# Patient Record
Sex: Female | Born: 1970 | Race: White | Hispanic: No | Marital: Married | Smoking: Former smoker
Health system: Southern US, Community
[De-identification: ages and names within clinical notes are randomized; demographics above are authoritative.]

## PROBLEM LIST (undated history)

## (undated) DIAGNOSIS — Z789 Other specified health status: Secondary | ICD-10-CM

## (undated) HISTORY — PX: TUBAL LIGATION: SHX77

## (undated) HISTORY — PX: BREAST BIOPSY: SHX20

## (undated) HISTORY — DX: Other specified health status: Z78.9

---

## 2009-07-11 HISTORY — PX: UTERINE FIBROID SURGERY: SHX826

## 2013-11-14 ENCOUNTER — Other Ambulatory Visit: Payer: Self-pay | Admitting: Obstetrics and Gynecology

## 2013-11-14 DIAGNOSIS — Z1231 Encounter for screening mammogram for malignant neoplasm of breast: Secondary | ICD-10-CM

## 2013-11-29 ENCOUNTER — Ambulatory Visit
Admission: RE | Admit: 2013-11-29 | Discharge: 2013-11-29 | Disposition: A | Discharge status: Home / Self Care | Payer: BC Managed Care – PPO | Source: Ambulatory Visit | Attending: Obstetrics and Gynecology | Admitting: Obstetrics and Gynecology

## 2013-11-29 DIAGNOSIS — Z1231 Encounter for screening mammogram for malignant neoplasm of breast: Secondary | ICD-10-CM

## 2013-12-03 ENCOUNTER — Other Ambulatory Visit: Payer: Self-pay | Admitting: Obstetrics and Gynecology

## 2013-12-03 DIAGNOSIS — R928 Other abnormal and inconclusive findings on diagnostic imaging of breast: Secondary | ICD-10-CM

## 2013-12-13 ENCOUNTER — Ambulatory Visit
Admission: RE | Admit: 2013-12-13 | Discharge: 2013-12-13 | Disposition: A | Discharge status: Home / Self Care | Payer: BC Managed Care – PPO | Source: Ambulatory Visit | Attending: Obstetrics and Gynecology | Admitting: Obstetrics and Gynecology

## 2013-12-13 ENCOUNTER — Encounter (INDEPENDENT_AMBULATORY_CARE_PROVIDER_SITE_OTHER): Payer: Self-pay

## 2013-12-13 DIAGNOSIS — R928 Other abnormal and inconclusive findings on diagnostic imaging of breast: Secondary | ICD-10-CM

## 2015-08-28 ENCOUNTER — Other Ambulatory Visit: Payer: Self-pay

## 2015-08-28 DIAGNOSIS — Z1231 Encounter for screening mammogram for malignant neoplasm of breast: Secondary | ICD-10-CM

## 2015-10-09 ENCOUNTER — Ambulatory Visit
Admission: RE | Admit: 2015-10-09 | Discharge: 2015-10-09 | Disposition: A | Discharge status: Home / Self Care | Payer: BC Managed Care – PPO | Source: Ambulatory Visit

## 2015-10-09 DIAGNOSIS — Z1231 Encounter for screening mammogram for malignant neoplasm of breast: Secondary | ICD-10-CM

## 2015-10-13 ENCOUNTER — Other Ambulatory Visit: Payer: Self-pay | Admitting: Obstetrics and Gynecology

## 2015-10-13 DIAGNOSIS — R928 Other abnormal and inconclusive findings on diagnostic imaging of breast: Secondary | ICD-10-CM

## 2015-10-22 ENCOUNTER — Other Ambulatory Visit: Payer: Self-pay | Admitting: Obstetrics and Gynecology

## 2015-10-22 ENCOUNTER — Ambulatory Visit
Admission: RE | Admit: 2015-10-22 | Discharge: 2015-10-22 | Disposition: A | Discharge status: Home / Self Care | Payer: BC Managed Care – PPO | Source: Ambulatory Visit | Attending: Obstetrics and Gynecology | Admitting: Obstetrics and Gynecology

## 2015-10-22 DIAGNOSIS — N6310 Unspecified lump in the right breast, unspecified quadrant: Secondary | ICD-10-CM

## 2015-10-22 DIAGNOSIS — R928 Other abnormal and inconclusive findings on diagnostic imaging of breast: Secondary | ICD-10-CM

## 2015-10-26 ENCOUNTER — Other Ambulatory Visit: Payer: Self-pay | Admitting: Obstetrics and Gynecology

## 2015-10-26 DIAGNOSIS — N6310 Unspecified lump in the right breast, unspecified quadrant: Secondary | ICD-10-CM

## 2015-10-27 ENCOUNTER — Ambulatory Visit
Admission: RE | Admit: 2015-10-27 | Discharge: 2015-10-27 | Disposition: A | Discharge status: Home / Self Care | Payer: BC Managed Care – PPO | Source: Ambulatory Visit | Attending: Obstetrics and Gynecology | Admitting: Obstetrics and Gynecology

## 2015-10-27 DIAGNOSIS — N6310 Unspecified lump in the right breast, unspecified quadrant: Secondary | ICD-10-CM

## 2016-08-31 ENCOUNTER — Other Ambulatory Visit: Payer: Self-pay | Admitting: Obstetrics and Gynecology

## 2016-08-31 DIAGNOSIS — N63 Unspecified lump in unspecified breast: Secondary | ICD-10-CM

## 2016-10-10 ENCOUNTER — Ambulatory Visit
Admission: RE | Admit: 2016-10-10 | Discharge: 2016-10-10 | Disposition: A | Discharge status: Home / Self Care | Payer: BC Managed Care – PPO | Source: Ambulatory Visit | Attending: Obstetrics and Gynecology | Admitting: Obstetrics and Gynecology

## 2016-10-10 ENCOUNTER — Other Ambulatory Visit: Payer: Self-pay | Admitting: Obstetrics and Gynecology

## 2016-10-10 DIAGNOSIS — N63 Unspecified lump in unspecified breast: Secondary | ICD-10-CM

## 2020-06-25 ENCOUNTER — Other Ambulatory Visit: Payer: Self-pay | Admitting: *Deleted

## 2020-06-25 DIAGNOSIS — Z1231 Encounter for screening mammogram for malignant neoplasm of breast: Secondary | ICD-10-CM

## 2020-06-29 ENCOUNTER — Other Ambulatory Visit: Payer: Self-pay | Admitting: Obstetrics and Gynecology

## 2020-06-29 DIAGNOSIS — Z1231 Encounter for screening mammogram for malignant neoplasm of breast: Secondary | ICD-10-CM

## 2020-08-10 ENCOUNTER — Ambulatory Visit: Payer: BC Managed Care – PPO

## 2020-09-22 ENCOUNTER — Ambulatory Visit
Admission: RE | Admit: 2020-09-22 | Discharge: 2020-09-22 | Disposition: A | Discharge status: Home / Self Care | Payer: BC Managed Care – PPO | Source: Ambulatory Visit | Attending: Obstetrics and Gynecology | Admitting: Obstetrics and Gynecology

## 2020-09-22 ENCOUNTER — Other Ambulatory Visit: Payer: Self-pay

## 2020-09-22 DIAGNOSIS — Z1231 Encounter for screening mammogram for malignant neoplasm of breast: Secondary | ICD-10-CM

## 2022-01-25 ENCOUNTER — Other Ambulatory Visit: Payer: Self-pay | Admitting: Obstetrics and Gynecology

## 2022-01-25 DIAGNOSIS — Z1231 Encounter for screening mammogram for malignant neoplasm of breast: Secondary | ICD-10-CM

## 2022-02-18 ENCOUNTER — Ambulatory Visit
Admission: RE | Admit: 2022-02-18 | Discharge: 2022-02-18 | Disposition: A | Discharge status: Home / Self Care | Payer: BC Managed Care – PPO | Source: Ambulatory Visit | Attending: Obstetrics and Gynecology | Admitting: Obstetrics and Gynecology

## 2022-02-18 DIAGNOSIS — Z1231 Encounter for screening mammogram for malignant neoplasm of breast: Secondary | ICD-10-CM

## 2022-09-17 IMAGING — MG MM DIGITAL SCREENING BILAT W/ TOMO AND CAD
8 series · 8 of 24 positions shown · non-contrast
Comparison: Previous exam(s).

CLINICAL DATA: Screening.

EXAM:
DIGITAL SCREENING BILATERAL MAMMOGRAM WITH TOMOSYNTHESIS AND CAD
TECHNIQUE: Bilateral screening digital craniocaudal and mediolateral oblique
mammograms were obtained. Bilateral screening digital breast
tomosynthesis was performed. The images were evaluated with
computer-aided detection.

[L CC synth-2D]
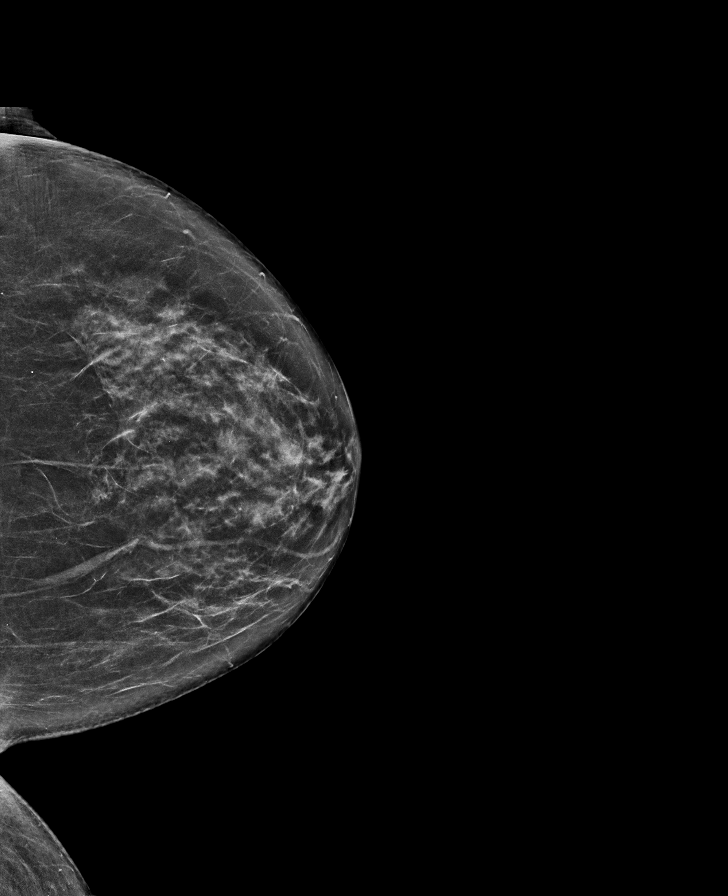

[R CC synth-2D]
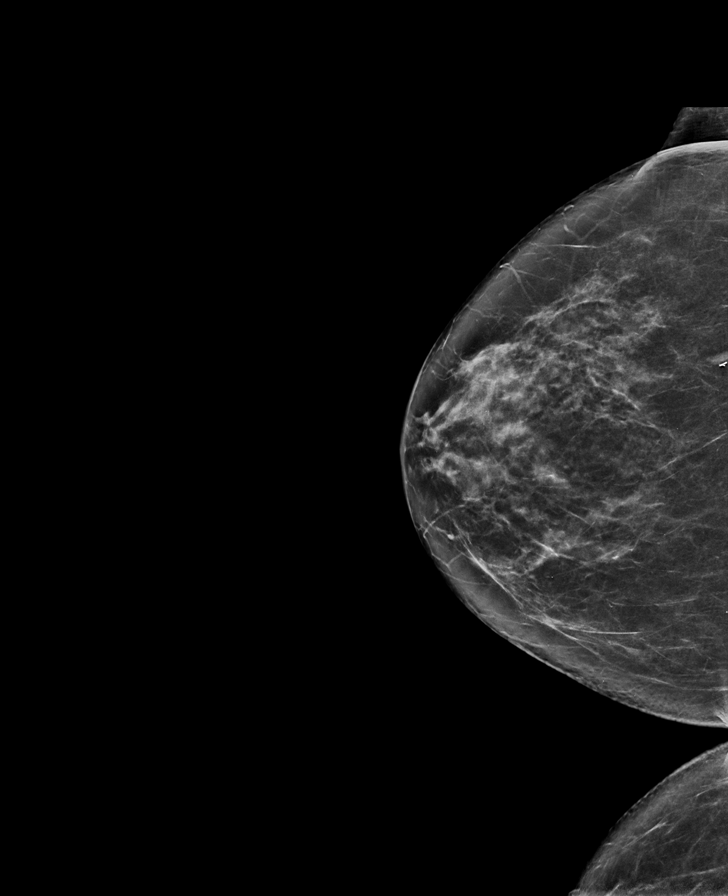

[R MLO synth-2D]
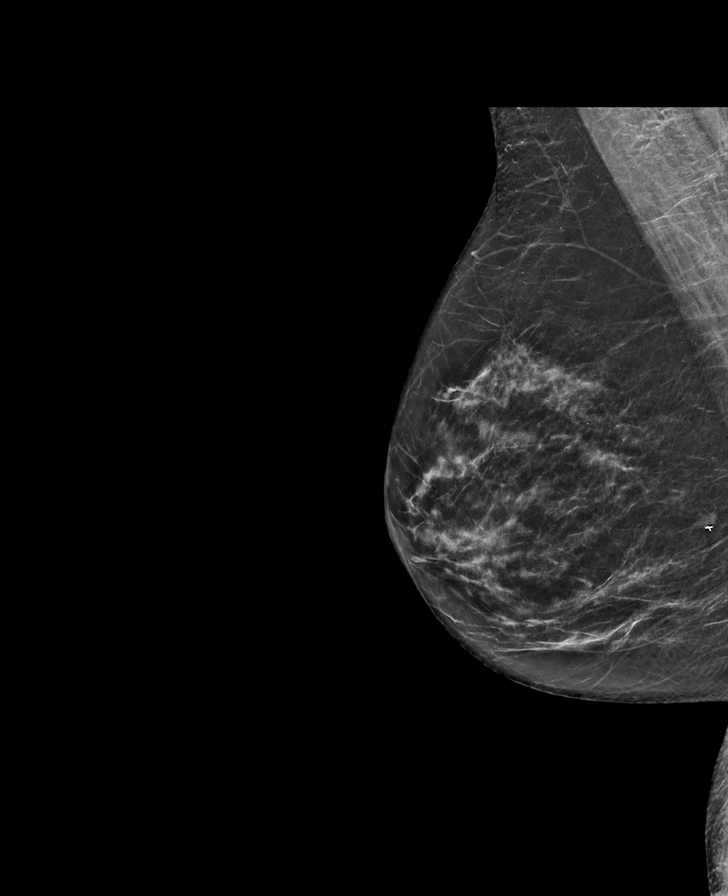

[L MLO synth-2D]
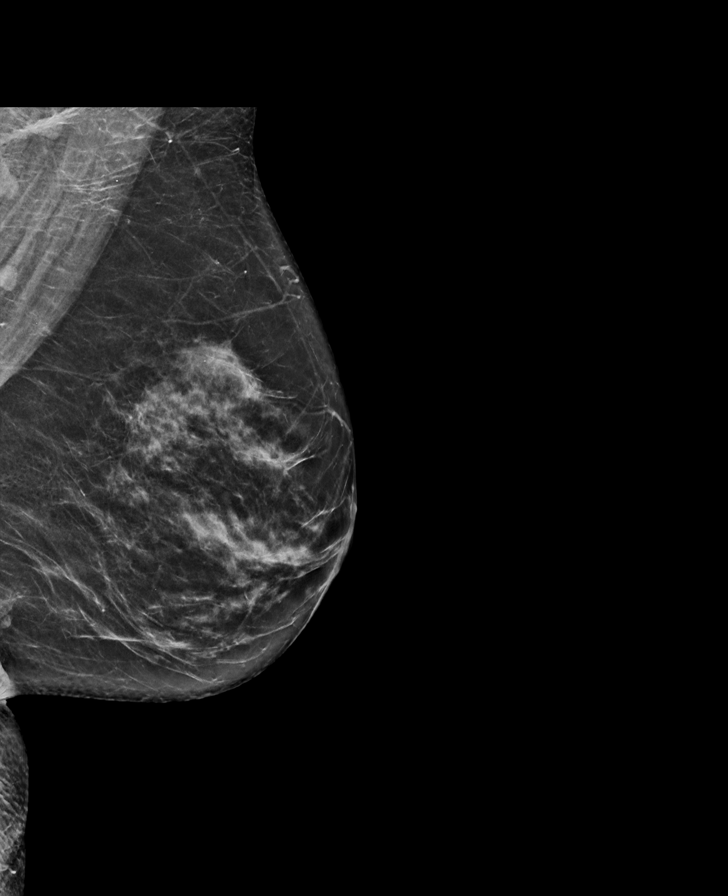

[L MLO tomo · tomo slice 36/71.0]
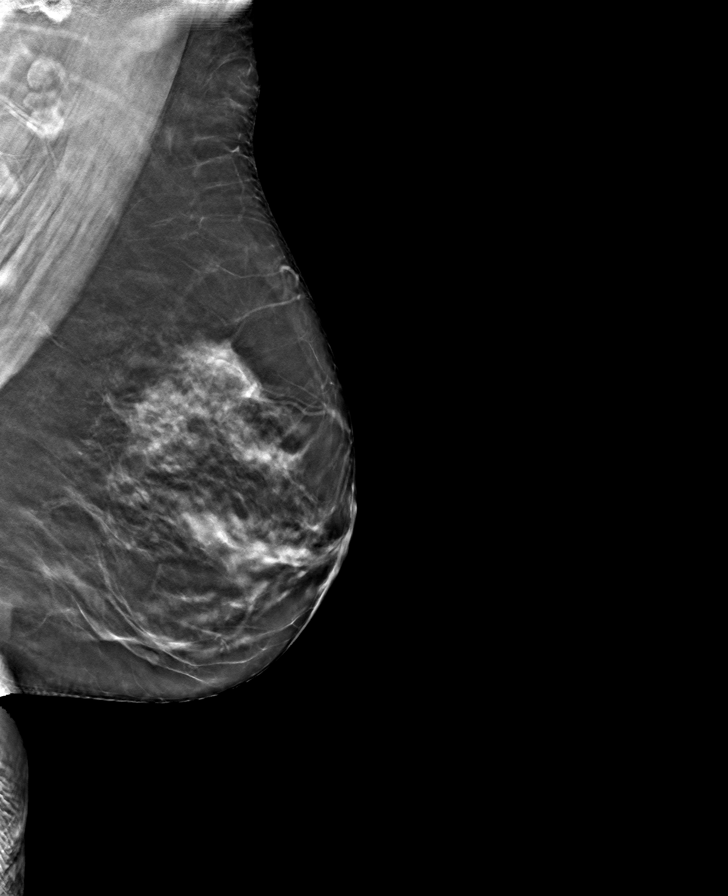

[R CC tomo · tomo slice 33/66.0]
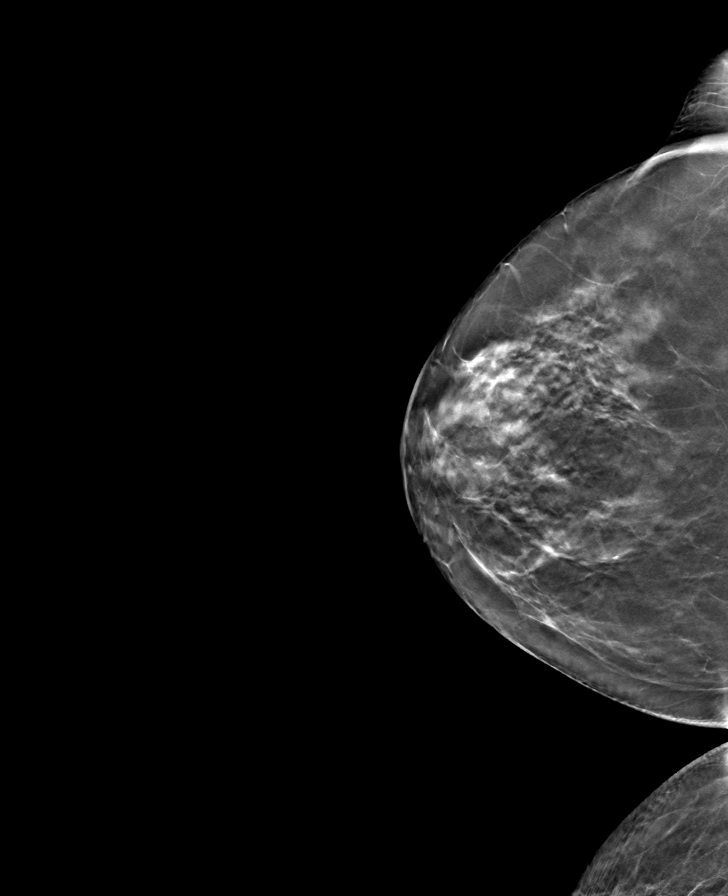

[R MLO tomo · tomo slice 33/64.0]
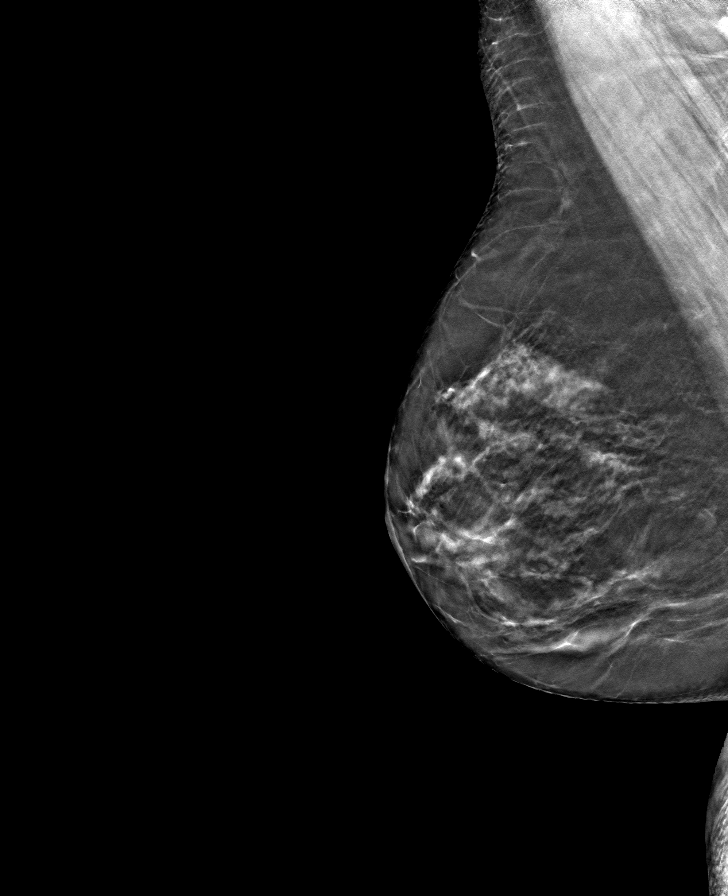

[L CC tomo · tomo slice 35/68.0]
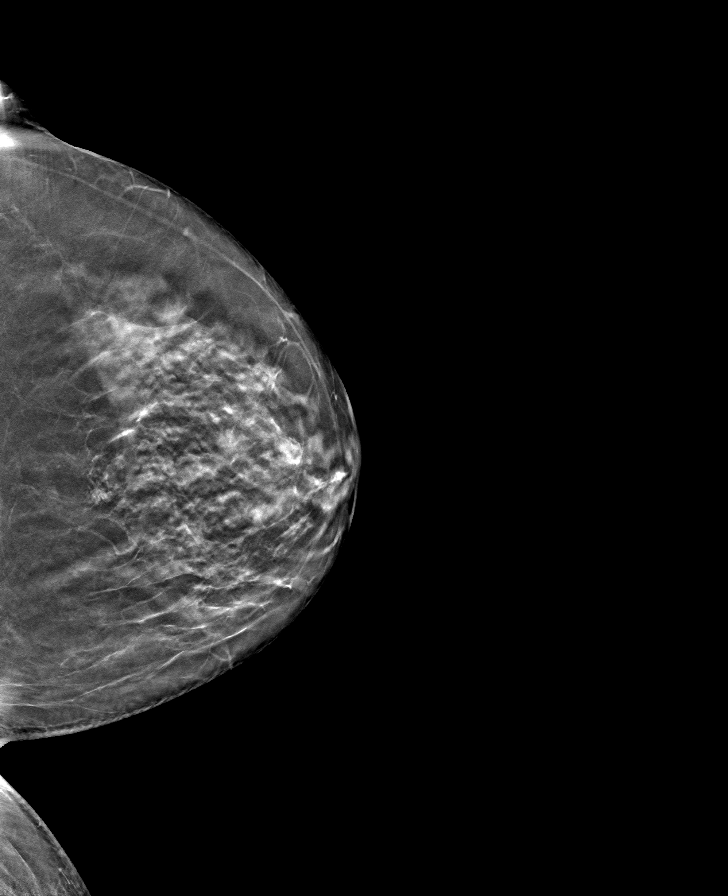

[8 of 24 positions shown; findings below may reference images not displayed]

ACR Breast Density Category c: The breast tissue is heterogeneously
dense, which may obscure small masses.
FINDINGS: There are no findings suspicious for malignancy. The images were
evaluated with computer-aided detection.
IMPRESSION: No mammographic evidence of malignancy. A result letter of this
screening mammogram will be mailed directly to the patient.

RECOMMENDATION:
Screening mammogram in one year. (Code:T4-5-GWO)

BI-RADS CATEGORY  1: Negative.

## 2023-08-03 ENCOUNTER — Other Ambulatory Visit: Payer: Self-pay | Admitting: Obstetrics and Gynecology

## 2023-08-03 DIAGNOSIS — Z0000 Encounter for general adult medical examination without abnormal findings: Secondary | ICD-10-CM

## 2023-08-08 ENCOUNTER — Ambulatory Visit
Admission: RE | Admit: 2023-08-08 | Discharge: 2023-08-08 | Disposition: A | Discharge status: Home / Self Care | Payer: 59 | Source: Ambulatory Visit | Attending: Obstetrics and Gynecology | Admitting: Obstetrics and Gynecology

## 2023-08-08 DIAGNOSIS — Z0000 Encounter for general adult medical examination without abnormal findings: Secondary | ICD-10-CM

## 2023-08-31 ENCOUNTER — Encounter: Payer: Self-pay | Admitting: Family Medicine

## 2023-08-31 ENCOUNTER — Ambulatory Visit (INDEPENDENT_AMBULATORY_CARE_PROVIDER_SITE_OTHER): Payer: 59 | Admitting: Family Medicine

## 2023-08-31 VITALS — BP 102/70 | HR 60 | Temp 97.4°F | Ht 68.0 in | Wt 167.0 lb

## 2023-08-31 DIAGNOSIS — M545 Low back pain, unspecified: Secondary | ICD-10-CM | POA: Diagnosis not present

## 2023-08-31 DIAGNOSIS — G8929 Other chronic pain: Secondary | ICD-10-CM

## 2023-08-31 DIAGNOSIS — Z23 Encounter for immunization: Secondary | ICD-10-CM | POA: Diagnosis not present

## 2023-08-31 DIAGNOSIS — Z1211 Encounter for screening for malignant neoplasm of colon: Secondary | ICD-10-CM | POA: Diagnosis not present

## 2023-08-31 NOTE — Assessment & Plan Note (Signed)
 Chronic Patient reports ongoing back pain, affecting sleep and requiring frequent use of ibuprofen. No imaging or further diagnostic workup planned at this time. -Consider heat therapy and other non-pharmacological interventions for pain management. -Consider replacing mattress for improved sleep comfort. -Advise against long-term daily use of ibuprofen due to potential risks of ulcers and kidney damage.

## 2023-08-31 NOTE — Progress Notes (Signed)
 New Patient Office Visit  Subjective    Patient ID: Hannah Blevins, female    DOB: 01-08-1971  Age: 53 y.o. MRN: [id number]  CC:  Chief Complaint  Patient presents with   New Patient (Initial Visit)    HPI Hannah Blevins is a 53 year old female who presents to establish care.  She experiences significant back pain that impacts her ability to sleep. Patient mentioned to have back pain for a few years and it is getting worse lately. The pain is severe enough that she requires the entire bed to herself and uses multiple pillows to find a comfortable position. She is uncertain if the pain is related to her mattress, which she acknowledges may need replacing.  She has trouble sleeping, which she attributes in part to her back pain. The discomfort at night has led to her husband sleeping in a separate room. No headaches or other significant symptoms are reported.  She has a history of thin skin, leading to easy bruising, which has been a long-standing issue since her school years. She was advised to use lotion and avoid sun exposure at that time. She has not been diagnosed with any anemias.  Her family history includes a grandmother who may have had mild diabetes. She has no known history of high cholesterol, high blood pressure, thyroid problems, or diabetes. She has not been on any medications and does not have a history of significant medical issues.  Patient had her mammogram last month. She is due for colon and cervical cancer screening.  She is also due for her influenza, Tdap and shingles vaccine.  No outpatient encounter medications on file as of 08/31/2023.   No facility-administered encounter medications on file as of 08/31/2023.    Past Medical History:  Diagnosis Date   No pertinent past medical history     Past Surgical History:  Procedure Laterality Date   BREAST BIOPSY Right    fibroadenoma   TUBAL LIGATION  10/08/2011   UTERINE FIBROID SURGERY  2011     Family History  Problem Relation Age of Onset   Breast cancer Mother    Lung cancer Father    Ovarian cancer Sister     Social History   Socioeconomic History   Marital status: Married    Spouse name: Not on file   Number of children: 1   Years of education: Not on file   Highest education level: 12th grade  Occupational History   Occupation: school nutrition    Employer: [employer]  Tobacco Use   Smoking status: Former    Current packs/day: 1.00    Average packs/day: 1 pack/day for 25.0 years (25.0 ttl pk-yrs)    Types: Cigarettes   Smokeless tobacco: Never  Vaping Use   Vaping status: Every Day  Substance and Sexual Activity   Alcohol use: Not Currently    Comment: quit 2014   Drug use: Never   Sexual activity: Yes    Partners: Male    Birth control/protection: Surgical  Other Topics Concern   Not on file  Social History Narrative   Not on file   Social Drivers of Health   Financial Resource Strain: Low Risk  (08/27/2023)   Overall Financial Resource Strain (CARDIA)    Difficulty of Paying Living Expenses: Not very hard  Food Insecurity: No Food Insecurity (08/27/2023)   Hunger Vital Sign    Worried About Running Out of Food in the Last Year: Never true  Ran Out of Food in the Last Year: Never true  Transportation Needs: No Transportation Needs (08/27/2023)   PRAPARE - Administrator, Civil Service (Medical): No    Lack of Transportation (Non-Medical): No  Physical Activity: Unknown (08/27/2023)   Exercise Vital Sign    Days of Exercise per Week: 0 days    Minutes of Exercise per Session: Not on file  Stress: Stress Concern Present (08/27/2023)   [employer] of Occupational Health - Occupational Stress Questionnaire    Feeling of Stress : To some extent  Social Connections: Unknown (08/27/2023)   Social Connection and Isolation Panel [NHANES]    Frequency of Communication with Friends and Family: Patient declined     Frequency of Social Gatherings with Friends and Family: Patient declined    Attends Religious Services: Patient declined    Database administrator or Organizations: Patient declined    Attends Engineer, structural: Not on file    Marital Status: Married  Catering manager Violence: Not on file    Review of Systems  Constitutional:  Negative for chills, fever and malaise/fatigue.  HENT:  Negative for congestion and ear pain.   Respiratory:  Negative for cough and shortness of breath.   Cardiovascular:  Negative for chest pain, palpitations and leg swelling.  Gastrointestinal:  Negative for abdominal pain, constipation, diarrhea, nausea and vomiting.  Genitourinary:  Negative for dysuria, frequency and urgency.  Musculoskeletal:  Positive for back pain. Negative for neck pain.  Neurological:  Negative for dizziness and headaches.  Psychiatric/Behavioral:  Negative for depression and suicidal ideas. The patient is nervous/anxious and has insomnia.       Objective    BP 102/70   Pulse 60   Temp (!) 97.4 F (36.3 C)   Ht 5\' 8"  (1.727 m)   Wt 167 lb (75.8 kg)   LMP  (LMP Unknown)   SpO2 98%   BMI 25.39 kg/m   Physical Exam Constitutional:      General: She is not in acute distress.    Appearance: Normal appearance. She is not ill-appearing.  Eyes:     Conjunctiva/sclera: Conjunctivae normal.  Cardiovascular:     Rate and Rhythm: Normal rate and regular rhythm.     Heart sounds: Normal heart sounds. No murmur heard. Pulmonary:     Effort: Pulmonary effort is normal.     Breath sounds: Normal breath sounds. No wheezing.  Musculoskeletal:        General: Normal range of motion.  Skin:    General: Skin is warm.  Neurological:     Mental Status: She is alert. Mental status is at baseline.  Psychiatric:        Mood and Affect: Mood normal.        Behavior: Behavior normal.         Assessment & Plan:   Problem List Items Addressed This Visit       Other    Chronic bilateral low back pain - Primary   Chronic Patient reports ongoing back pain, affecting sleep and requiring frequent use of ibuprofen. No imaging or further diagnostic workup planned at this time. -Consider heat therapy and other non-pharmacological interventions for pain management. -Consider replacing mattress for improved sleep comfort. -Advise against long-term daily use of ibuprofen due to potential risks of ulcers and kidney damage.      Screen for colon cancer   Relevant Orders   Cologuard   Encounter for immunization   Relevant Orders  Influenza, MDCK, trivalent, PF(Flucelvax egg-free) (Completed)    Follow-up: Return for Annual Physical, labs .   Total time spent on today's visit was 35 minutes, including both face-to-face time and nonface-to-face time personally spent on review of chart (labs and imaging), discussing labs and goals, discussing further work-up, treatment options, referrals to specialist if needed, reviewing outside records if pertinent, answering patient's questions, and coordinating care.    Lajuana Matte, FNP [facility] [phone]

## 2023-09-18 ENCOUNTER — Telehealth: Payer: Self-pay | Admitting: Family Medicine

## 2023-09-18 ENCOUNTER — Encounter: Payer: 59 | Admitting: Family Medicine

## 2023-09-18 NOTE — Telephone Encounter (Signed)
 Left a message for the patient to call back and reschedule her appt on 09/18/23 due to the provider being out of the office.

## 2023-09-26 LAB — COLOGUARD: COLOGUARD: NEGATIVE

## 2023-10-10 ENCOUNTER — Ambulatory Visit (INDEPENDENT_AMBULATORY_CARE_PROVIDER_SITE_OTHER): Admitting: Family Medicine

## 2023-10-10 ENCOUNTER — Encounter: Payer: Self-pay | Admitting: Family Medicine

## 2023-10-10 VITALS — BP 104/66 | HR 66 | Temp 98.1°F | Resp 16 | Ht 68.0 in | Wt 167.4 lb

## 2023-10-10 DIAGNOSIS — F17200 Nicotine dependence, unspecified, uncomplicated: Secondary | ICD-10-CM | POA: Insufficient documentation

## 2023-10-10 DIAGNOSIS — R5382 Chronic fatigue, unspecified: Secondary | ICD-10-CM | POA: Diagnosis not present

## 2023-10-10 DIAGNOSIS — Z114 Encounter for screening for human immunodeficiency virus [HIV]: Secondary | ICD-10-CM | POA: Insufficient documentation

## 2023-10-10 DIAGNOSIS — Z0000 Encounter for general adult medical examination without abnormal findings: Secondary | ICD-10-CM | POA: Diagnosis not present

## 2023-10-10 DIAGNOSIS — R233 Spontaneous ecchymoses: Secondary | ICD-10-CM | POA: Insufficient documentation

## 2023-10-10 DIAGNOSIS — Z122 Encounter for screening for malignant neoplasm of respiratory organs: Secondary | ICD-10-CM | POA: Insufficient documentation

## 2023-10-10 DIAGNOSIS — Z23 Encounter for immunization: Secondary | ICD-10-CM

## 2023-10-10 DIAGNOSIS — Z1159 Encounter for screening for other viral diseases: Secondary | ICD-10-CM | POA: Insufficient documentation

## 2023-10-10 DIAGNOSIS — Z124 Encounter for screening for malignant neoplasm of cervix: Secondary | ICD-10-CM | POA: Insufficient documentation

## 2023-10-10 NOTE — Progress Notes (Signed)
 Subjective:  Patient ID: Hannah Blevins, female    DOB: September 04, 1970  Age: 53 y.o. MRN: [id number]  Chief Complaint  Patient presents with   Annual Exam    Discussed the use of AI scribe software for clinical note transcription with the patient, who gave verbal consent to proceed.  Well Adult Physical: Patient here for a comprehensive physical exam.The patient reports no problems Do you take any herbs or supplements that were not prescribed by a doctor? no Are you taking calcium supplements? no Are you taking aspirin daily? no  Encounter for general adult medical examination without abnormal findings  Physical ("At Risk" items are starred): Patient's last physical exam was 1 year ago .  Patient is not afflicted from Stress Incontinence and Urge Incontinence  Patient wears a seat belts Patient has smoke detectors and has carbon monoxide detectors. Patient practices appropriate gun safety. Patient wears sunscreen with extended sun exposure. Dental Care: brushes and flosses daily. Ophthalmology/Optometry: Annual visit.  Hearing loss: none Vision impairments: none  Menarche: 11 Menstrual History: normal LMP: N/A Pregnancy history: 2 Safe at home: yes Self breast exams: no Patient educated on the importance of self breast exams.  HPI Hannah Blevins is a 53 year old female who presents for an annual physical exam.  No chest pain, sore throat, or ear pain. No issues with her sinuses. Her senses of hearing, sight, taste, and smell are intact.  She experiences occasional stress incontinence, with urine leakage when sneezing or coughing, though not consistently.  She has occasional shortness of breath, particularly with exertion. She has a history of smoking for at least twenty years before switching to vaping.  She has been treated for venous insufficiency with sclerotherapy. Her legs feel heavy, and pressing on a vein causes it to disappear temporarily before returning.  She  mentions having sensitive teeth and wears dentures on the top. She brushes and flosses daily but only visits the dentist when necessary.  She has had a normal mammogram in the past and completed a Cologuard test with negative results.     08/31/2023    1:52 PM  Depression screen PHQ 2/9  Decreased Interest 0  Down, Depressed, Hopeless 0  PHQ - 2 Score 0         10/10/2023    1:50 PM  Fall Risk  Falls in the past year? 0  Was there an injury with Fall? 0  Fall Risk Category Calculator 0  Patient at Risk for Falls Due to No Fall Risks  Fall risk Follow up Falls evaluation completed             Social Hx   Social History   Socioeconomic History   Marital status: Married    Spouse name: Not on file   Number of children: 1   Years of education: Not on file   Highest education level: 12th grade  Occupational History   Occupation: school nutrition    Employer: [employer]  Tobacco Use   Smoking status: Former    Current packs/day: 1.00    Average packs/day: 1 pack/day for 25.0 years (25.0 ttl pk-yrs)    Types: Cigarettes   Smokeless tobacco: Never  Vaping Use   Vaping status: Every Day  Substance and Sexual Activity   Alcohol use: Not Currently    Comment: quit 2014   Drug use: Never   Sexual activity: Yes    Partners: Male    Birth control/protection: Surgical  Other  Topics Concern   Not on file  Social History Narrative   Not on file   Social Drivers of Health   Financial Resource Strain: Low Risk  (08/27/2023)   Overall Financial Resource Strain (CARDIA)    Difficulty of Paying Living Expenses: Not very hard  Food Insecurity: No Food Insecurity (08/27/2023)   Hunger Vital Sign    Worried About Running Out of Food in the Last Year: Never true    Ran Out of Food in the Last Year: Never true  Transportation Needs: No Transportation Needs (08/27/2023)   PRAPARE - Administrator, Civil Service (Medical): No    Lack of Transportation  (Non-Medical): No  Physical Activity: Unknown (08/27/2023)   Exercise Vital Sign    Days of Exercise per Week: 0 days    Minutes of Exercise per Session: Not on file  Stress: Stress Concern Present (08/27/2023)   [employer] of Occupational Health - Occupational Stress Questionnaire    Feeling of Stress : To some extent  Social Connections: Unknown (08/27/2023)   Social Connection and Isolation Panel [NHANES]    Frequency of Communication with Friends and Family: Patient declined    Frequency of Social Gatherings with Friends and Family: Patient declined    Attends Religious Services: Patient declined    Database administrator or Organizations: Patient declined    Attends Engineer, structural: Not on file    Marital Status: Married   Past Medical History:  Diagnosis Date   No pertinent past medical history    Past Surgical History:  Procedure Laterality Date   BREAST BIOPSY Right    fibroadenoma   TUBAL LIGATION  10/08/2011   UTERINE FIBROID SURGERY  2011    Family History  Problem Relation Age of Onset   Breast cancer Mother    Lung cancer Father    Ovarian cancer Sister     Review of Systems  Constitutional:  Positive for fatigue. Negative for chills, diaphoresis and fever.  HENT:  Negative for congestion, ear pain and sinus pain.   Respiratory:  Negative for cough and shortness of breath.   Cardiovascular:  Negative for chest pain.  Gastrointestinal:  Negative for abdominal pain, constipation, nausea and vomiting.  Genitourinary:  Negative for dysuria.  Musculoskeletal:  Negative for arthralgias.  Neurological:  Negative for weakness and headaches.  Psychiatric/Behavioral:  Negative for dysphoric mood. The patient is not nervous/anxious.      Objective:  BP 104/66   Pulse 66   Temp 98.1 F (36.7 C) (Temporal)   Resp 16   Ht 5\' 8"  (1.727 m)   Wt 167 lb 6.4 oz (75.9 kg)   LMP  (LMP Unknown)   SpO2 100%   BMI 25.45 kg/m      10/10/2023    1:45  PM 08/31/2023    1:36 PM  BP/Weight  Systolic BP 104 102  Diastolic BP 66 70  Wt. (Lbs) 167.4 167  BMI 25.45 kg/m2 25.39 kg/m2    Physical Exam Vitals reviewed. Exam conducted with a chaperone present.  Constitutional:      General: She is not in acute distress.    Appearance: Normal appearance. She is well-groomed. She is not ill-appearing.  HENT:     Head: Normocephalic.     Right Ear: Tympanic membrane, ear canal and external ear normal.     Left Ear: Tympanic membrane, ear canal and external ear normal.     Nose:     Right  Sinus: No maxillary sinus tenderness or frontal sinus tenderness.     Left Sinus: No maxillary sinus tenderness or frontal sinus tenderness.     Mouth/Throat:     Mouth: Mucous membranes are moist.     Pharynx: Oropharynx is clear.  Eyes:     Extraocular Movements: Extraocular movements intact.     Conjunctiva/sclera: Conjunctivae normal.     Pupils: Pupils are equal, round, and reactive to light.  Neck:     Vascular: No carotid bruit.  Cardiovascular:     Rate and Rhythm: Normal rate and regular rhythm.     Pulses: Normal pulses.     Heart sounds: Normal heart sounds. No murmur heard. Pulmonary:     Effort: Pulmonary effort is normal.     Breath sounds: Normal breath sounds. No wheezing.  Abdominal:     General: Bowel sounds are normal.     Palpations: Abdomen is soft. There is no mass.     Tenderness: There is no abdominal tenderness. There is no guarding.  Genitourinary:    Exam position: Lithotomy position.     Pubic Area: Rash present.     Labia:        Right: Rash present. No tenderness or lesion.        Left: Rash present. No tenderness or lesion.      Urethra: No urethral pain or urethral swelling.     Vagina: Normal.     Cervix: Friability and cervical bleeding present. No discharge.     Uterus: Normal.      Comments: Cervical polyp visible on exam. Pap smear obtained Musculoskeletal:        General: Normal range of motion.      Cervical back: Normal range of motion and neck supple. No tenderness.  Lymphadenopathy:     Cervical: No cervical adenopathy.  Skin:    General: Skin is warm and dry.     Findings: Bruising and erythema present.  Neurological:     Mental Status: She is alert and oriented to person, place, and time. Mental status is at baseline.     Cranial Nerves: Cranial nerves 2-12 are intact.     Sensory: Sensation is intact.     Motor: No weakness.     Coordination: Coordination is intact.     Gait: Gait is intact.     Deep Tendon Reflexes: Reflexes are normal and symmetric.  Psychiatric:        Attention and Perception: Attention normal.        Mood and Affect: Mood normal.        Speech: Speech normal.        Behavior: Behavior normal. Behavior is cooperative.        Thought Content: Thought content normal.        Cognition and Memory: Cognition normal.        Judgment: Judgment normal.     No results found for: "WBC", "HGB", "HCT", "PLT", "GLUCOSE", "CHOL", "TRIG", "HDL", "LDLDIRECT", "LDLCALC", "ALT", "AST", "NA", "K", "CL", "CREATININE", "BUN", "CO2", "TSH", "PSA", "INR", "GLUF", "HGBA1C", "MICROALBUR"       Assessment & Plan:   Annual physical exam Assessment & Plan: Routine health maintenance discussed, including vaccinations and screenings. Tdap vaccination is due. Cologuard test was completed with negative results. Mammogram was completed with normal results. Discussed the option of Shingrix vaccine for shingles at next visit. - Administer Tdap vaccination - Offer HIV and Hepatitis C screening - Discuss Shingrix vaccine for shingles at next  visit  Things to do to keep yourself healthy  - Exercise at least 30-45 minutes a day, 3-4 days a week.  - Eat a low-fat diet with lots of fruits and vegetables, up to 7-9 servings per day.  - Seatbelts can save your life. Wear them always.  - Smoke detectors on every level of your home, check batteries every year.  - Eye Doctor - have an  eye exam every 1-2 years  - Safe sex - if you may be exposed to STDs, use a condom.  - Alcohol -  If you drink, do it moderately, less than 2 drinks per day.  - Health Care Power of Attorney. Choose someone to speak for you if you are not able.  - Depression is common in our stressful world.If you're feeling down or losing interest in things you normally enjoy, please come in for a visit.  - Violence - If anyone is threatening or hurting you, please call immediately.   Orders: -     CBC with Differential/Platelet -     CMP14+EGFR -     Lipid panel -     TSH  Chronic fatigue Assessment & Plan: Labs drawn, Await labs/testing for assessment and recommendations   Orders: -     VITAMIN D 25 Hydroxy (Vit-D Deficiency, Fractures)  Bruises easily Assessment & Plan: Patient states that her skin bruises very easily. Most likely this is related to her long history of smoking.   Nicotine dependence due to vaping non-tobacco product Assessment & Plan: Nicotine dependence with over 20 years of smoking history, currently using vaping as a substitute. Discussed potential health risks associated with vaping and importance of cessation.   Screening for cervical cancer Assessment & Plan: Pap smear obtained. Patient tolerated procedure well with no complications  Orders: -     IGP, Aptima HPV, rfx 16/18,45  Encounter for screening for HIV -     HIV Antibody (routine testing w rflx)  Encounter for hepatitis C screening test for low risk patient -     Hepatitis C antibody  Screening for lung cancer Assessment & Plan: >20 pack years of smoking  - order lung CT screening for lung cancer  Orders: -     CT CHEST LUNG CANCER SCREENING LOW DOSE WO CONTRAST; Future  Encounter for immunization -     Tdap vaccine greater than or equal to 7yo IM     Body mass index is 25.45 kg/m.   These are the goals we discussed:  Goals      Quit Smoking     - Patient would love to quit vaping -  She used to smoke for over 20 years and is going to get a low dose CT for lung cancer screening.         This is a list of the screening recommended for you and due dates:  Health Maintenance  Topic Date Due   HIV Screening  Never done   Hepatitis C Screening  Never done   DTaP/Tdap/Td vaccine (1 - Tdap) Never done   Pap with HPV screening  Never done   Screening for Lung Cancer  Never done   Zoster (Shingles) Vaccine (1 of 2) Never done   COVID-19 Vaccine (1 - 2024-25 season) Never done   Flu Shot  02/09/2024   Mammogram  08/07/2025   Colon Cancer Screening  09/13/2033   HPV Vaccine  Aged Out     No orders of the defined types were  placed in this encounter.   Follow-up: Return in about 1 year (around 10/09/2024) for Annual Physical.  An After Visit Summary was printed and given to the patient.  Lajuana Matte, FNP [facility] [phone]

## 2023-10-10 NOTE — Assessment & Plan Note (Addendum)
 Routine health maintenance discussed, including vaccinations and screenings. Tdap vaccination is due. Cologuard test was completed with negative results. Mammogram was completed with normal results. Discussed the option of Shingrix vaccine for shingles at next visit. - Administer Tdap vaccination - Offer HIV and Hepatitis C screening - Discuss Shingrix vaccine for shingles at next visit  Things to do to keep yourself healthy  - Exercise at least 30-45 minutes a day, 3-4 days a week.  - Eat a low-fat diet with lots of fruits and vegetables, up to 7-9 servings per day.  - Seatbelts can save your life. Wear them always.  - Smoke detectors on every level of your home, check batteries every year.  - Eye Doctor - have an eye exam every 1-2 years  - Safe sex - if you may be exposed to STDs, use a condom.  - Alcohol -  If you drink, do it moderately, less than 2 drinks per day.  - Health Care Power of Attorney. Choose someone to speak for you if you are not able.  - Depression is common in our stressful world.If you're feeling down or losing interest in things you normally enjoy, please come in for a visit.  - Violence - If anyone is threatening or hurting you, please call immediately.

## 2023-10-10 NOTE — Assessment & Plan Note (Signed)
 Pap smear obtained. Patient tolerated procedure well with no complications

## 2023-10-10 NOTE — Assessment & Plan Note (Signed)
 Labs drawn, Await labs/testing for assessment and recommendations

## 2023-10-10 NOTE — Assessment & Plan Note (Signed)
>  20 pack years of smoking  - order lung CT screening for lung cancer

## 2023-10-10 NOTE — Assessment & Plan Note (Signed)
 Patient states that her skin bruises very easily. Most likely this is related to her long history of smoking.

## 2023-10-10 NOTE — Assessment & Plan Note (Signed)
 Nicotine dependence with over 20 years of smoking history, currently using vaping as a substitute. Discussed potential health risks associated with vaping and importance of cessation.

## 2023-10-11 LAB — CBC WITH DIFFERENTIAL/PLATELET
Basophils Absolute: 0.1 10*3/uL (ref 0.0–0.2)
Basos: 1 %
EOS (ABSOLUTE): 0.8 10*3/uL — ABNORMAL HIGH (ref 0.0–0.4)
Eos: 8 %
Hematocrit: 39.1 % (ref 34.0–46.6)
Hemoglobin: 13.3 g/dL (ref 11.1–15.9)
Immature Grans (Abs): 0 10*3/uL (ref 0.0–0.1)
Immature Granulocytes: 0 %
Lymphocytes Absolute: 3 10*3/uL (ref 0.7–3.1)
Lymphs: 31 %
MCH: 29 pg (ref 26.6–33.0)
MCHC: 34 g/dL (ref 31.5–35.7)
MCV: 85 fL (ref 79–97)
Monocytes Absolute: 0.5 10*3/uL (ref 0.1–0.9)
Monocytes: 5 %
Neutrophils Absolute: 5.2 10*3/uL (ref 1.4–7.0)
Neutrophils: 55 %
Platelets: 308 10*3/uL (ref 150–450)
RBC: 4.58 x10E6/uL (ref 3.77–5.28)
RDW: 12.7 % (ref 11.7–15.4)
WBC: 9.5 10*3/uL (ref 3.4–10.8)

## 2023-10-11 LAB — TSH: TSH: 0.601 u[IU]/mL (ref 0.450–4.500)

## 2023-10-11 LAB — CMP14+EGFR
ALT: 15 IU/L (ref 0–32)
AST: 16 IU/L (ref 0–40)
Albumin: 4.3 g/dL (ref 3.8–4.9)
Alkaline Phosphatase: 68 IU/L (ref 44–121)
BUN/Creatinine Ratio: 32 — ABNORMAL HIGH (ref 9–23)
BUN: 23 mg/dL (ref 6–24)
Bilirubin Total: <0.2 mg/dL (ref 0.0–1.2)
CO2: 23 mmol/L (ref 20–29)
Calcium: 9.3 mg/dL (ref 8.7–10.2)
Chloride: 104 mmol/L (ref 96–106)
Creatinine, Ser: 0.73 mg/dL (ref 0.57–1.00)
Globulin, Total: 2.7 g/dL (ref 1.5–4.5)
Glucose: 80 mg/dL (ref 70–99)
Potassium: 3.9 mmol/L (ref 3.5–5.2)
Sodium: 141 mmol/L (ref 134–144)
Total Protein: 7 g/dL (ref 6.0–8.5)
eGFR: 98 mL/min/{1.73_m2} (ref >59)

## 2023-10-11 LAB — VITAMIN D 25 HYDROXY (VIT D DEFICIENCY, FRACTURES): Vit D, 25-Hydroxy: 37 ng/mL (ref 30.0–100.0)

## 2023-10-11 LAB — LIPID PANEL
Chol/HDL Ratio: 3.1 ratio (ref 0.0–4.4)
Cholesterol, Total: 154 mg/dL (ref 100–199)
HDL: 49 mg/dL (ref >39)
LDL Chol Calc (NIH): 78 mg/dL (ref 0–99)
Triglycerides: 160 mg/dL — ABNORMAL HIGH (ref 0–149)
VLDL Cholesterol Cal: 27 mg/dL (ref 5–40)

## 2023-10-11 LAB — HIV ANTIBODY (ROUTINE TESTING W REFLEX): HIV Screen 4th Generation wRfx: NONREACTIVE

## 2023-10-11 LAB — HEPATITIS C ANTIBODY: Hep C Virus Ab: NONREACTIVE

## 2023-10-17 LAB — IGP, APTIMA HPV, RFX 16/18,45
HPV Aptima: NEGATIVE
PAP Smear Comment: 0

## 2023-12-06 ENCOUNTER — Ambulatory Visit: Payer: Self-pay

## 2023-12-06 NOTE — Telephone Encounter (Signed)
  Chief Complaint: back pain Symptoms: lower back pain radiating down legs to toes, 9-10/10 Frequency: since Friday  Pertinent Negatives: NA Disposition: [] ED /[] Urgent Care (no appt availability in office) / [x] Appointment(In office/virtual)/ []  [facility]/ [] Home Care/ [] Refused Recommended Disposition /[] [facility]/ []  Follow-up with PCP Additional Notes: pt has been using Ibuprofen, Bengay, and Lidocaine patches. Does get slight relief. Has been short staffed at work and feels like could have come from there. Appt scheduled tomorrow with PCP.   Copied from CRM [phone]. Topic: Clinical - Red Word Triage >> Dec 06, 2023 10:07 AM Baldomero Bone wrote: Red Word that prompted transfer to Nurse Triage: Back problems, most painful and longest she has ever been in pain since Friday. Pain level gets to 9 or 10 all the way to toes depending on the way she moves. Callback number is [phone] Reason for Disposition  [1] MODERATE back pain (e.g., interferes with normal activities) AND [2] present > 3 days  Answer Assessment - Initial Assessment Questions 1. ONSET: "When did the pain begin?"      Since Friday  2. LOCATION: "Where does it hurt?" (upper, mid or lower back)     Lower back  3. SEVERITY: "How bad is the pain?"  (e.g., Scale 1-10; mild, moderate, or severe)   - MILD (1-3): Doesn't interfere with normal activities.    - MODERATE (4-7): Interferes with normal activities or awakens from sleep.    - SEVERE (8-10): Excruciating pain, unable to do any normal activities.      9-10/10 4. PATTERN: "Is the pain constant?" (e.g., yes, no; constant, intermittent)      Intermittent but staying constant  5. RADIATION: "Does the pain shoot into your legs or somewhere else?"     Down legs to toes  8. MEDICINES: "What have you taken so far for the pain?" (e.g., nothing, acetaminophen, NSAIDS)     Bengay, Ibuprofen, Lidocaine patches -slight relief.  10. OTHER SYMPTOMS: "Do you have  any other symptoms?" (e.g., fever, abdomen pain, burning with urination, blood in urine)  Protocols used: Back Pain-A-AH

## 2023-12-07 ENCOUNTER — Ambulatory Visit: Admitting: Family Medicine

## 2023-12-07 ENCOUNTER — Encounter: Payer: Self-pay | Admitting: Family Medicine

## 2023-12-07 VITALS — BP 102/70 | HR 65 | Temp 97.8°F | Resp 16 | Ht 68.0 in | Wt 168.6 lb

## 2023-12-07 DIAGNOSIS — M5441 Lumbago with sciatica, right side: Secondary | ICD-10-CM | POA: Diagnosis not present

## 2023-12-07 DIAGNOSIS — M5442 Lumbago with sciatica, left side: Secondary | ICD-10-CM | POA: Diagnosis not present

## 2023-12-07 MED ORDER — CYCLOBENZAPRINE HCL 5 MG PO TABS: 5.0000 mg | ORAL_TABLET | Freq: Three times a day (TID) | ORAL | 1 refills | Status: AC | PRN

## 2023-12-07 MED ORDER — DICLOFENAC POTASSIUM 50 MG PO TABS: 50.0000 mg | ORAL_TABLET | Freq: Three times a day (TID) | ORAL | 0 refills | Status: AC

## 2023-12-07 MED ORDER — PREDNISONE 20 MG PO TABS: ORAL_TABLET | ORAL | 0 refills | Status: AC

## 2023-12-07 NOTE — Patient Instructions (Signed)
 Managing Pain Without Opioids Opioids are strong medicines used to treat moderate to severe pain. For some people, especially those who have long-term (chronic) pain, opioids may not be the best choice for pain management due to: Side effects like nausea, constipation, and sleepiness. The risk of addiction (opioid use disorder). The longer you take opioids, the greater your risk of addiction. Pain that lasts for more than 3 months is called chronic pain. Managing chronic pain usually requires more than one approach and is often provided by a team of health care providers working together (multidisciplinary approach). Pain management may be done at a pain management center or pain clinic. How to manage pain without the use of opioids Use non-opioid medicines Non-opioid medicines for pain may include: Over-the-counter or prescription non-steroidal anti-inflammatory drugs (NSAIDs). These may be the first medicines used for pain. They work well for muscle and bone pain, and they reduce swelling. Acetaminophen. This over-the-counter medicine may work well for milder pain but not swelling. Antidepressants. These may be used to treat chronic pain. A certain type of antidepressant (tricyclics) is often used. These medicines are given in lower doses for pain than when used for depression. Anticonvulsants. These are usually used to treat seizures but may also reduce nerve (neuropathic) pain. Muscle relaxants. These relieve pain caused by sudden muscle tightening (spasms). You may also use a pain medicine that is applied to the skin as a patch, cream, or gel (topical analgesic), such as a numbing medicine. These may cause fewer side effects than medicines taken by mouth. Do certain therapies as directed Some therapies can help with pain management. They include: Physical therapy. You will do exercises to gain strength and flexibility. A physical therapist may teach you exercises to move and stretch parts of  your body that are weak, stiff, or painful. You can learn these exercises at physical therapy visits and practice them at home. Physical therapy may also involve: Massage. Heat wraps or applying heat or cold to affected areas. Electrical signals that interrupt pain signals (transcutaneous electrical nerve stimulation, TENS). Weak lasers that reduce pain and swelling (low-level laser therapy). Signals from your body that help you learn to regulate pain (biofeedback). Occupational therapy. This helps you to learn ways to function at home and work with less pain. Recreational therapy. This involves trying new activities or hobbies, such as a physical activity or drawing. Mental health therapy, including: Cognitive behavioral therapy (CBT). This helps you learn coping skills for dealing with pain. Acceptance and commitment therapy (ACT) to change the way you think and react to pain. Relaxation therapies, including muscle relaxation exercises and mindfulness-based stress reduction. Pain management counseling. This may be individual, family, or group counseling.  Receive medical treatments Medical treatments for pain management include: Nerve block injections. These may include a pain blocker and anti-inflammatory medicines. You may have injections: Near the spine to relieve chronic back or neck pain. Into joints to relieve back or joint pain. Into nerve areas that supply a painful area to relieve body pain. Into muscles (trigger point injections) to relieve some painful muscle conditions. A medical device placed near your spine to help block pain signals and relieve nerve pain or chronic back pain (spinal cord stimulation device). Acupuncture. Follow these instructions at home Medicines Take over-the-counter and prescription medicines only as told by your health care provider. If you are taking pain medicine, ask your health care providers about possible side effects to watch out for. Do not  drive or use heavy machinery  while taking prescription opioid pain medicine. Lifestyle  Do not use drugs or alcohol to reduce pain. If you drink alcohol, limit how much you have to: 0-1 drink a day for women who are not pregnant. 0-2 drinks a day for men. Know how much alcohol is in a drink. In the [place] one drink equals one 12 oz bottle of beer (355 mL), one 5 oz glass of wine (148 mL), or one 1 oz glass of hard liquor (44 mL). Do not use any products that contain nicotine or tobacco. These products include cigarettes, chewing tobacco, and vaping devices, such as e-cigarettes. If you need help quitting, ask your health care provider. Eat a healthy diet and maintain a healthy weight. Poor diet and excess weight may make pain worse. Eat foods that are high in fiber. These include fresh fruits and vegetables, whole grains, and beans. Limit foods that are high in fat and processed sugars, such as fried and sweet foods. Exercise regularly. Exercise lowers stress and may help relieve pain. Ask your health care provider what activities and exercises are safe for you. If your health care provider approves, join an exercise class that combines movement and stress reduction. Examples include yoga and tai chi. Get enough sleep. Lack of sleep may make pain worse. Lower stress as much as possible. Practice stress reduction techniques as told by your therapist. General instructions Work with all your pain management providers to find the treatments that work best for you. You are an important member of your pain management team. There are many things you can do to reduce pain on your own. Consider joining an online or in-person support group for people who have chronic pain. Keep all follow-up visits. This is important. Where to find more information You can find more information about managing pain without opioids from: American Academy of Pain Medicine: painmed.org Institute for Chronic Pain:  instituteforchronicpain.org American Chronic Pain Association: theacpa.org Contact a health care provider if: You have side effects from pain medicine. Your pain gets worse or does not get better with treatments or home therapy. You are struggling with anxiety or depression. Summary Many types of pain can be managed without opioids. Chronic pain may respond better to pain management without opioids. Pain is best managed when you and a team of health care providers work together. Pain management without opioids may include non-opioid medicines, medical treatments, physical therapy, mental health therapy, and lifestyle changes. Tell your health care providers if your pain gets worse or is not being managed well enough. This information is not intended to replace advice given to you by your health care provider. Make sure you discuss any questions you have with your health care provider. Document Revised: 10/07/2020 Document Reviewed: 10/07/2020 Elsevier Patient Education  2024 [employer].

## 2023-12-07 NOTE — Assessment & Plan Note (Signed)
 Low back pain with sciatica Acute exacerbation likely due to increased physical demands. Pain radiates down both legs intermittently. No bowel or bladder dysfunction. Discussed rest and medication options including prednisone, diclofenac, and Flexeril. Referral to [facility] for further evaluation. - Prescribe prednisone taper for inflammation. - Prescribe diclofenac three times daily for pain. - Prescribe Flexeril at night for muscle spasms. - Refer to [facility] for evaluation and management, including potential x-ray, injections, TENS unit, acupuncture, and physical therapy. - Advise rest and taking a day off work. - Re-evaluate liver and kidney function if symptoms persist or with continued medication use.

## 2023-12-07 NOTE — Progress Notes (Signed)
 Acute Office Visit  Subjective:    Patient ID: Hannah Blevins, female    DOB: 11-01-1970, 53 y.o.   MRN: [id number]  Chief Complaint  Patient presents with   Back Pain    Lower back pain x one week    Discussed the use of AI scribe software for clinical note transcription with the patient, who gave verbal consent to proceed.  History of Present Illness   Hannah Blevins is a 53 year old female who presents with severe back pain radiating to her legs.  She has been experiencing sudden onset of severe back pain since Friday, which worsened by Saturday. The pain radiates down both legs, similar to sciatica, and varies in which leg it affects. The pain is less severe when she is at home and resting.  She works in [employer], which involves a lot of bending, turning, and lifting. The pain has been exacerbated by increased workload due to being short-staffed. Despite having sick days available, she has not taken time off work as she feels guilty about leaving her team short-handed.  In the past, she has used lidocaine patches and ibuprofen for similar episodes of back pain, which typically resolved within a day or two. However, this episode has been more severe and prolonged. She has also used a heat wrap with a heat patch and has taken ibuprofen for pain relief. She has not tried any exercises or stronger medications like muscle relaxers or steroids before.  No loss of bowel or bladder function. She has not tried the TENS unit this time, although she has used it in the past. She has not used Voltaren gel or other topical treatments besides lidocaine patches.  She is concerned about her ability to continue working, especially with upcoming commitments such as working in [employer] for an upcoming camp.     Past Medical History:  Diagnosis Date   No pertinent past medical history     Past Surgical History:  Procedure Laterality Date   BREAST BIOPSY Right    fibroadenoma   TUBAL  LIGATION  10/08/2011   UTERINE FIBROID SURGERY  2011    Family History  Problem Relation Age of Onset   Breast cancer Mother    Cancer Mother    Lung cancer Father    Ovarian cancer Sister    Cancer Sister    Arthritis Brother     Social History   Socioeconomic History   Marital status: Married    Spouse name: Not on file   Number of children: 1   Years of education: Not on file   Highest education level: 12th grade  Occupational History   Occupation: school nutrition    Employer: [employer]  Tobacco Use   Smoking status: Former    Current packs/day: 1.00    Average packs/day: 1 pack/day for 25.0 years (25.0 ttl pk-yrs)    Types: Cigarettes   Smokeless tobacco: Never  Vaping Use   Vaping status: Every Day  Substance and Sexual Activity   Alcohol use: Not Currently    Comment: quit 2014   Drug use: Never   Sexual activity: Yes    Partners: Male    Birth control/protection: Surgical  Other Topics Concern   Not on file  Social History Narrative   Not on file   Social Drivers of Health   Financial Resource Strain: Low Risk  (08/27/2023)   Overall Financial Resource Strain (CARDIA)    Difficulty of Paying  Living Expenses: Not very hard  Food Insecurity: No Food Insecurity (08/27/2023)   Hunger Vital Sign    Worried About Running Out of Food in the Last Year: Never true    Ran Out of Food in the Last Year: Never true  Transportation Needs: No Transportation Needs (08/27/2023)   PRAPARE - Administrator, Civil Service (Medical): No    Lack of Transportation (Non-Medical): No  Physical Activity: Unknown (08/27/2023)   Exercise Vital Sign    Days of Exercise per Week: 0 days    Minutes of Exercise per Session: Not on file  Stress: Stress Concern Present (08/27/2023)   [employer] of Occupational Health - Occupational Stress Questionnaire    Feeling of Stress : To some extent  Social Connections: Unknown (08/27/2023)   Social Connection  and Isolation Panel [NHANES]    Frequency of Communication with Friends and Family: Patient declined    Frequency of Social Gatherings with Friends and Family: Patient declined    Attends Religious Services: Patient declined    Database administrator or Organizations: Patient declined    Attends Engineer, structural: Not on file    Marital Status: Married  Catering manager Violence: Not on file    Outpatient Medications Prior to Visit  Medication Sig Dispense Refill   ibuprofen (ADVIL) 200 MG tablet Take 200 mg by mouth every 6 (six) hours as needed for mild pain (pain score 1-3) or moderate pain (pain score 4-6).     No facility-administered medications prior to visit.    No Known Allergies  Review of Systems  Constitutional:  Negative for chills, diaphoresis, fatigue and fever.  HENT:  Negative for congestion, ear pain and sinus pain.   Eyes: Negative.   Respiratory:  Negative for cough and shortness of breath.   Cardiovascular:  Negative for chest pain.  Gastrointestinal:  Negative for abdominal pain, constipation, nausea and vomiting.  Endocrine: Negative.   Genitourinary:  Negative for dysuria.  Musculoskeletal:  Positive for back pain. Negative for arthralgias.  Allergic/Immunologic: Negative.   Neurological:  Negative for weakness and headaches.  Hematological: Negative.   Psychiatric/Behavioral:  Negative for dysphoric mood. The patient is not nervous/anxious.        Objective:         12/07/2023    3:29 PM 10/10/2023    1:45 PM 08/31/2023    1:36 PM  Vitals with BMI  Height 5\' 8"  5\' 8"  5\' 8"   Weight 168 lbs 10 oz 167 lbs 6 oz 167 lbs  BMI 25.64 25.46 25.4  Systolic 102 104 045  Diastolic 70 66 70  Pulse 65 66 60    No data found.   Physical Exam Vitals reviewed.  Constitutional:      General: She is not in acute distress.    Appearance: Normal appearance.  Eyes:     Conjunctiva/sclera: Conjunctivae normal.  Cardiovascular:     Rate and  Rhythm: Normal rate and regular rhythm.     Heart sounds: Normal heart sounds. No murmur heard. Pulmonary:     Effort: Pulmonary effort is normal.     Breath sounds: Normal breath sounds. No wheezing.  Musculoskeletal:     Lumbar back: Tenderness present. Decreased range of motion.  Skin:    General: Skin is warm.  Neurological:     Mental Status: She is alert. Mental status is at baseline.  Psychiatric:        Mood and Affect: Mood normal.  Behavior: Behavior normal.     Health Maintenance Due  Topic Date Due   Lung Cancer Screening  Never done   Zoster Vaccines- Shingrix (1 of 2) Never done   COVID-19 Vaccine (1 - 2024-25 season) Never done    There are no preventive care reminders to display for this patient.   Lab Results  Component Value Date   TSH 0.601 10/10/2023   Lab Results  Component Value Date   WBC 9.5 10/10/2023   HGB 13.3 10/10/2023   HCT 39.1 10/10/2023   MCV 85 10/10/2023   PLT 308 10/10/2023   Lab Results  Component Value Date   NA 141 10/10/2023   K 3.9 10/10/2023   CO2 23 10/10/2023   GLUCOSE 80 10/10/2023   BUN 23 10/10/2023   CREATININE 0.73 10/10/2023   BILITOT <0.2 10/10/2023   ALKPHOS 68 10/10/2023   AST 16 10/10/2023   ALT 15 10/10/2023   PROT 7.0 10/10/2023   ALBUMIN 4.3 10/10/2023   CALCIUM 9.3 10/10/2023   EGFR 98 10/10/2023   Lab Results  Component Value Date   CHOL 154 10/10/2023   Lab Results  Component Value Date   HDL 49 10/10/2023   Lab Results  Component Value Date   LDLCALC 78 10/10/2023   Lab Results  Component Value Date   TRIG 160 (H) 10/10/2023   Lab Results  Component Value Date   CHOLHDL 3.1 10/10/2023   No results found for: "HGBA1C"     Assessment & Plan:  Acute bilateral low back pain with bilateral sciatica Assessment & Plan: Low back pain with sciatica Acute exacerbation likely due to increased physical demands. Pain radiates down both legs intermittently. No bowel or bladder  dysfunction. Discussed rest and medication options including prednisone, diclofenac, and Flexeril. Referral to [facility] for further evaluation. - Prescribe prednisone taper for inflammation. - Prescribe diclofenac three times daily for pain. - Prescribe Flexeril at night for muscle spasms. - Refer to [facility] for evaluation and management, including potential x-ray, injections, TENS unit, acupuncture, and physical therapy. - Advise rest and taking a day off work. - Re-evaluate liver and kidney function if symptoms persist or with continued medication use.   Orders: -     Ambulatory referral to Orthopedics -     predniSONE; Take 3 tablets (60 mg total) by mouth daily with breakfast for 3 days, THEN 2 tablets (40 mg total) daily with breakfast for 3 days, THEN 1 tablet (20 mg total) daily with breakfast for 3 days.  Dispense: 18 tablet; Refill: 0 -     Cyclobenzaprine HCl; Take 1 tablet (5 mg total) by mouth 3 (three) times daily as needed for muscle spasms.  Dispense: 30 tablet; Refill: 1 -     Diclofenac Potassium; Take 1 tablet (50 mg total) by mouth 3 (three) times daily.  Dispense: 90 tablet; Refill: 0     Meds ordered this encounter  Medications   predniSONE (DELTASONE) 20 MG tablet    Sig: Take 3 tablets (60 mg total) by mouth daily with breakfast for 3 days, THEN 2 tablets (40 mg total) daily with breakfast for 3 days, THEN 1 tablet (20 mg total) daily with breakfast for 3 days.    Dispense:  18 tablet    Refill:  0   cyclobenzaprine (FLEXERIL) 5 MG tablet    Sig: Take 1 tablet (5 mg total) by mouth 3 (three) times daily as needed for muscle spasms.    Dispense:  30 tablet    Refill:  1   diclofenac  (CATAFLAM ) 50 MG tablet    Sig: Take 1 tablet (50 mg total) by mouth 3 (three) times daily.    Dispense:  90 tablet    Refill:  0    Orders Placed This Encounter  Procedures   AMB referral to orthopedics     Follow-up: Return if symptoms worsen or fail to improve.  An  After Visit Summary was printed and given to the patient.  Delford Felling, FNP [facility] [phone]

## 2024-03-15 ENCOUNTER — Telehealth: Payer: Self-pay | Admitting: Family Medicine

## 2024-03-15 NOTE — Telephone Encounter (Signed)
 Left message for the patient to call back and reschedule her appt on 10/10/24.  The provider will be out of the office.

## 2024-10-10 ENCOUNTER — Encounter: Admitting: Family Medicine
